# Patient Record
Sex: Female | Born: 1990 | Race: White | Hispanic: No | Marital: Single | Smoking: Former smoker
Health system: Southern US, Community
[De-identification: ages and names within clinical notes are randomized; demographics above are authoritative.]

## PROBLEM LIST (undated history)

## (undated) DIAGNOSIS — N809 Endometriosis, unspecified: Secondary | ICD-10-CM

---

## 2021-09-27 ENCOUNTER — Other Ambulatory Visit: Payer: Self-pay

## 2021-09-27 ENCOUNTER — Encounter (HOSPITAL_COMMUNITY): Payer: Self-pay

## 2021-09-27 ENCOUNTER — Emergency Department (HOSPITAL_COMMUNITY): Payer: Medicaid Other

## 2021-09-27 ENCOUNTER — Emergency Department (HOSPITAL_COMMUNITY)
Admission: EM | Admit: 2021-09-27 | Discharge: 2021-09-27 | Disposition: A | Discharge status: Home / Self Care | Payer: Medicaid Other | Attending: Emergency Medicine | Admitting: Emergency Medicine

## 2021-09-27 DIAGNOSIS — O4690 Antepartum hemorrhage, unspecified, unspecified trimester: Secondary | ICD-10-CM

## 2021-09-27 DIAGNOSIS — Z3A00 Weeks of gestation of pregnancy not specified: Secondary | ICD-10-CM | POA: Insufficient documentation

## 2021-09-27 DIAGNOSIS — O039 Complete or unspecified spontaneous abortion without complication: Secondary | ICD-10-CM | POA: Insufficient documentation

## 2021-09-27 DIAGNOSIS — N9489 Other specified conditions associated with female genital organs and menstrual cycle: Secondary | ICD-10-CM | POA: Diagnosis not present

## 2021-09-27 DIAGNOSIS — O209 Hemorrhage in early pregnancy, unspecified: Secondary | ICD-10-CM | POA: Diagnosis present

## 2021-09-27 DIAGNOSIS — R102 Pelvic and perineal pain: Secondary | ICD-10-CM

## 2021-09-27 HISTORY — DX: Endometriosis, unspecified: N80.9

## 2021-09-27 LAB — COMPREHENSIVE METABOLIC PANEL
ALT: 17 U/L (ref 0–44)
AST: 21 U/L (ref 15–41)
Albumin: 4.6 g/dL (ref 3.5–5.0)
Alkaline Phosphatase: 38 U/L (ref 38–126)
Anion gap: 9 (ref 5–15)
BUN: 14 mg/dL (ref 6–20)
CO2: 23 mmol/L (ref 22–32)
Calcium: 9.7 mg/dL (ref 8.9–10.3)
Chloride: 104 mmol/L (ref 98–111)
Creatinine, Ser: 0.77 mg/dL (ref 0.44–1.00)
GFR, Estimated: >60 mL/min (ref >60)
Glucose, Bld: 105 mg/dL — ABNORMAL HIGH (ref 70–99)
Potassium: 3.8 mmol/L (ref 3.5–5.1)
Sodium: 136 mmol/L (ref 135–145)
Total Bilirubin: 0.8 mg/dL (ref 0.3–1.2)
Total Protein: 7.5 g/dL (ref 6.5–8.1)

## 2021-09-27 LAB — CBC WITH DIFFERENTIAL/PLATELET
Abs Immature Granulocytes: 0.06 10*3/uL (ref 0.00–0.07)
Basophils Absolute: 0.1 10*3/uL (ref 0.0–0.1)
Basophils Relative: 1 %
Eosinophils Absolute: 0.2 10*3/uL (ref 0.0–0.5)
Eosinophils Relative: 1 %
HCT: 42 % (ref 36.0–46.0)
Hemoglobin: 14.5 g/dL (ref 12.0–15.0)
Immature Granulocytes: 0 %
Lymphocytes Relative: 11 %
Lymphs Abs: 1.8 10*3/uL (ref 0.7–4.0)
MCH: 30.9 pg (ref 26.0–34.0)
MCHC: 34.5 g/dL (ref 30.0–36.0)
MCV: 89.6 fL (ref 80.0–100.0)
Monocytes Absolute: 0.6 10*3/uL (ref 0.1–1.0)
Monocytes Relative: 4 %
Neutro Abs: 12.8 10*3/uL — ABNORMAL HIGH (ref 1.7–7.7)
Neutrophils Relative %: 83 %
Platelets: 372 10*3/uL (ref 150–400)
RBC: 4.69 MIL/uL (ref 3.87–5.11)
RDW: 13.4 % (ref 11.5–15.5)
WBC: 15.6 10*3/uL — ABNORMAL HIGH (ref 4.0–10.5)
nRBC: 0 % (ref 0.0–0.2)

## 2021-09-27 LAB — TYPE AND SCREEN
ABO/RH(D): AB POS
Antibody Screen: NEGATIVE

## 2021-09-27 LAB — HCG, SERUM, QUALITATIVE: Preg, Serum: POSITIVE — AB

## 2021-09-27 LAB — HCG, QUANTITATIVE, PREGNANCY: hCG, Beta Chain, Quant, S: 3808 m[IU]/mL — ABNORMAL HIGH (ref <5)

## 2021-09-27 MED ORDER — FERROUS SULFATE 325 (65 FE) MG PO TABS: 325.0000 mg | ORAL_TABLET | Freq: Every day | ORAL | 0 refills | Status: AC

## 2021-09-27 MED ORDER — IBUPROFEN 600 MG PO TABS: 600.0000 mg | ORAL_TABLET | Freq: Four times a day (QID) | ORAL | 0 refills | Status: AC | PRN

## 2021-09-27 MED ORDER — OXYCODONE HCL 5 MG PO TABS
5.0000 mg | ORAL_TABLET | Freq: Once | ORAL | Status: AC
  Administered 2021-09-27: 5 mg via ORAL
  Filled 2021-09-27: qty 1

## 2021-09-27 MED ORDER — HYDROMORPHONE HCL 1 MG/ML IJ SOLN
1.0000 mg | Freq: Once | INTRAMUSCULAR | Status: AC
  Administered 2021-09-27: 1 mg via INTRAVENOUS
  Filled 2021-09-27: qty 1

## 2021-09-27 MED ORDER — SENNOSIDES-DOCUSATE SODIUM 8.6-50 MG PO TABS: 1.0000 | ORAL_TABLET | Freq: Every day | ORAL | 0 refills | Status: AC

## 2021-09-27 MED ORDER — IBUPROFEN 400 MG PO TABS
600.0000 mg | ORAL_TABLET | Freq: Once | ORAL | Status: AC
  Administered 2021-09-27: 600 mg via ORAL
  Filled 2021-09-27: qty 2

## 2021-09-27 MED ORDER — OXYCODONE-ACETAMINOPHEN 5-325 MG PO TABS: 1.0000 | ORAL_TABLET | Freq: Four times a day (QID) | ORAL | 0 refills | Status: AC | PRN

## 2021-09-27 MED ORDER — MORPHINE SULFATE (PF) 4 MG/ML IV SOLN
4.0000 mg | Freq: Once | INTRAVENOUS | Status: AC
  Administered 2021-09-27: 4 mg via INTRAVENOUS
  Filled 2021-09-27: qty 1

## 2021-09-27 MED ORDER — KETOROLAC TROMETHAMINE 30 MG/ML IJ SOLN
30.0000 mg | Freq: Once | INTRAMUSCULAR | Status: AC
  Administered 2021-09-27: 30 mg via INTRAVENOUS
  Filled 2021-09-27: qty 1

## 2021-09-27 NOTE — ED Provider Notes (Signed)
[facility] EMERGENCY DEPARTMENT Provider Note   CSN: [id number] Arrival date & time: 09/27/21  1324     History  Chief Complaint  Patient presents with   Abdominal Pain    Tara Andrade is a 31 y.o. female w/ hx of endometriosis presenting to Ed with vaginal bleeding, abdominal cramping.  Reports LMP was 2 months ago, typically has regular monthly periods - possibility of pregnancy at this time.  Reports began having bleeding 3 days ago, but today became severe, soaking through a pad per hour, with clots, and significant cramping.  No hx of anemia; not on iron.    Hx of 1 abortion, 1 C-section delivery in the past  She lives in [place], moved there 6 months ago, does not have an OBGYN yet.  NKDA No other medical problems No baseline meds  HPI     Home Medications Prior to Admission medications   Medication Sig Start Date End Date Taking? Authorizing Provider  ferrous sulfate 325 (65 FE) MG tablet Take 1 tablet (325 mg total) by mouth daily for 30 doses. 09/27/21 10/27/21 Yes Mallory Enriques, Carola Rhine, MD  ibuprofen (ADVIL) 600 MG tablet Take 1 tablet (600 mg total) by mouth every 6 (six) hours as needed for up to 30 doses for mild pain, moderate pain or cramping (Bleeding). 09/27/21  Yes Trumaine Wimer, Carola Rhine, MD  oxyCODONE-acetaminophen (PERCOCET/ROXICET) 5-325 MG tablet Take 1 tablet by mouth every 6 (six) hours as needed for up to 12 doses for severe pain. 09/27/21  Yes Allysia Ingles, Carola Rhine, MD  senna-docusate (SENOKOT-S) 8.6-50 MG tablet Take 1 tablet by mouth daily for 30 doses. 09/27/21 10/27/21 Yes Harald Quevedo, Carola Rhine, MD      Allergies    Patient has no known allergies.    Review of Systems   Review of Systems  Physical Exam Updated Vital Signs BP 108/77    Pulse (!) 58    Temp 98.2 F (36.8 C) (Oral)    Resp 18    Ht 5\' 2"  (1.575 m)    Wt 86.2 kg    SpO2 98%    BMI 34.75 kg/m  Physical Exam Constitutional:      General: She is not in acute distress. HENT:     Head:  Normocephalic and atraumatic.  Eyes:     Conjunctiva/sclera: Conjunctivae normal.     Pupils: Pupils are equal, round, and reactive to light.  Cardiovascular:     Rate and Rhythm: Normal rate and regular rhythm.  Pulmonary:     Effort: Pulmonary effort is normal. No respiratory distress.  Abdominal:     General: There is no distension.     Tenderness: There is no abdominal tenderness.  Genitourinary:    Comments: Exam performed with chaperone present. External: Normal external female genitalia. No lesions, rashes, drainage, or suspicious lymph nodes. Internal: No CMT. Cervix closed, blood and clots extruding from uterus. NNo lacerations. No foreign bodies.   Skin:    General: Skin is warm and dry.  Neurological:     General: No focal deficit present.     Mental Status: She is alert. Mental status is at baseline.  Psychiatric:        Mood and Affect: Mood normal.        Behavior: Behavior normal.    ED Results / Procedures / Treatments   Labs (all labs ordered are listed, but only abnormal results are displayed) Labs Reviewed  CBC WITH DIFFERENTIAL/PLATELET - Abnormal; Notable for the following components:  Result Value   WBC 15.6 (*)    Neutro Abs 12.8 (*)    All other components within normal limits  HCG, SERUM, QUALITATIVE - Abnormal; Notable for the following components:   Preg, Serum POSITIVE (*)    All other components within normal limits  COMPREHENSIVE METABOLIC PANEL - Abnormal; Notable for the following components:   Glucose, Bld 105 (*)    All other components within normal limits  HCG, QUANTITATIVE, PREGNANCY - Abnormal; Notable for the following components:   hCG, Beta Chain, Quant, S 3,808 (*)    All other components within normal limits  TYPE AND SCREEN    EKG None  Radiology US OB LESS THAN 14 WEEKS WITH OB TRANSVAGINAL  Result Date: 09/27/2021 CLINICAL DATA:  Increased vaginal bleeding EXAM: OBSTETRIC <14 WK [place] AND TRANSVAGINAL OB US  TECHNIQUE: Both transabdominal and transvaginal ultrasound examinations were performed for complete evaluation of the gestation as well as the maternal uterus, adnexal regions, and pelvic cul-de-sac. Transvaginal technique was performed to assess early pregnancy. COMPARISON:  None. FINDINGS: Intrauterine gestational sac: None Yolk sac:  Not Visualized. Embryo:  Not Visualized. Maternal uterus/adnexae: Right ovary measures 4.7 x 2.8 by 3.2 cm and contains probable corpus luteum. Left ovary measures 2.8 by 5.3 x 2.3 cm. No significant free fluid IMPRESSION: No IUP identified. Findings consistent with pregnancy of unknown location, differential of which includes recent failed pregnancy, IUP too early to visualize, and occult ectopic pregnancy. Recommend trending of HCG with repeat ultrasound as indicated. Electronically Signed   By: Donavan Foil M.D.   On: 09/27/2021 17:53    Procedures Procedures    Medications Ordered in ED Medications  morphine (PF) 4 MG/ML injection 4 mg (4 mg Intravenous Given 09/27/21 1526)  ketorolac (TORADOL) 30 MG/ML injection 30 mg (30 mg Intravenous Given 09/27/21 1522)  HYDROmorphone (DILAUDID) injection 1 mg (1 mg Intravenous Given 09/27/21 1636)  oxyCODONE (Oxy IR/ROXICODONE) immediate release tablet 5 mg (5 mg Oral Given 09/27/21 1850)  ibuprofen (ADVIL) tablet 600 mg (600 mg Oral Given 09/27/21 1850)    ED Course/ Medical Decision Making/ A&P Clinical Course as of 09/28/21 0946  Fri Sep 27, 2021  1524 Preg, Serum(!): POSITIVE [MT]    Clinical Course User Index [MT] Wyvonnia Dusky, MD                           Medical Decision Making Amount and/or Complexity of Data Reviewed Labs: ordered. Decision-making details documented in ED Course. Radiology: ordered.  Risk OTC drugs. Prescription drug management.   Heavy vaginal bleeding, abd pain  Ddx includes abnormal uterine bleeding vs pregnancy complication (including spontanous abortion) vs other  IV pain  medications ordered  Labs ordered and personally interpreted showing hgb wnl, WBC elevated 15K  Preg positive, beta hcg +3000 Ultrasoudn ordered and personally reviewed, showing no IUP.  Likely a completed abortion, but patient advised about need for OBGYN f/u and repeat hcg level for ectopic rule out.  Pain improved with IV medications on reassessment  We will discharge home with motrin, percocet, iron, and stool softener; return precautions discussed.        Final Clinical Impression(s) / ED Diagnoses Final diagnoses:  Spontaneous abortion    Rx / DC Orders ED Discharge Orders          Ordered    oxyCODONE-acetaminophen (PERCOCET/ROXICET) 5-325 MG tablet  Every 6 hours PRN        09/27/21  1837    ibuprofen (ADVIL) 600 MG tablet  Every 6 hours PRN        09/27/21 1837    senna-docusate (SENOKOT-S) 8.6-50 MG tablet  Daily        09/27/21 1837    ferrous sulfate 325 (65 FE) MG tablet  Daily        09/27/21 1837              Wyvonnia Dusky, MD 09/28/21 [phone]

## 2021-09-27 NOTE — ED Notes (Signed)
Got pt a soda and ice ok per DR

## 2021-09-27 NOTE — ED Notes (Signed)
ED Provider at bedside. 

## 2021-09-27 NOTE — Discharge Instructions (Addendum)
Please call to schedule follow-up appointment with an OB/GYN provider on Monday or Tuesday.  Someone will need to check your beta-hCG level, which is your pregnancy hormone, to make sure that it is trending down to 0.  Higher levels may indicate signs of an ectopic pregnancy.

## 2021-09-27 NOTE — ED Notes (Signed)
Assisted Dr with pelvic exam

## 2021-09-27 NOTE — ED Notes (Signed)
Patient transported to Ultrasound 

## 2021-09-27 NOTE — ED Triage Notes (Addendum)
Pt to er, pt states that she is here for abd pain and vaginal bleeding. pt states that she has a hx of endometriosis, states that she has missed her period for the past two months, states that she is now going through a tampon every 15-20 minutes.  Pt states that she might be pregnant.  Pt states that she started bleeding about three days ago, but today she started having some pain and the bleeding increased.

## 2023-03-22 IMAGING — US US OB < 14 WEEKS - US OB TV
1 series · 14 of 28 positions shown · non-contrast
Comparison: None.

CLINICAL DATA: Increased vaginal bleeding

EXAM:
OBSTETRIC <14 WK US AND TRANSVAGINAL OB US
TECHNIQUE: Both transabdominal and transvaginal ultrasound examinations were
performed for complete evaluation of the gestation as well as the
maternal uterus, adnexal regions, and pelvic cul-de-sac.
Transvaginal technique was performed to assess early pregnancy.

[Series 1: us ob less than 14 weeks with ob transvaginal · 14 of 105 slices shown]
[im 4/105]
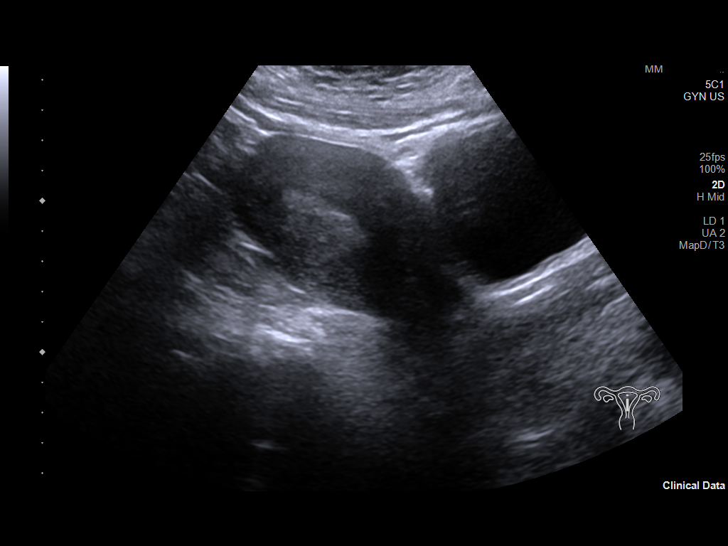
[im 12/105]
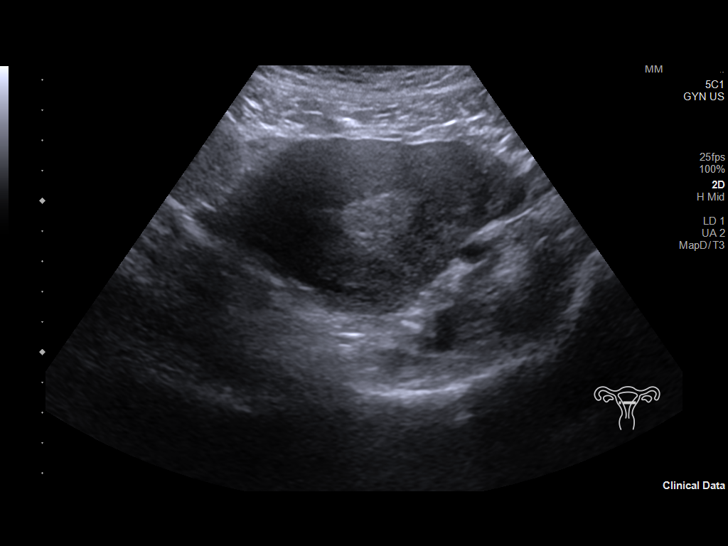
[im 20/105]
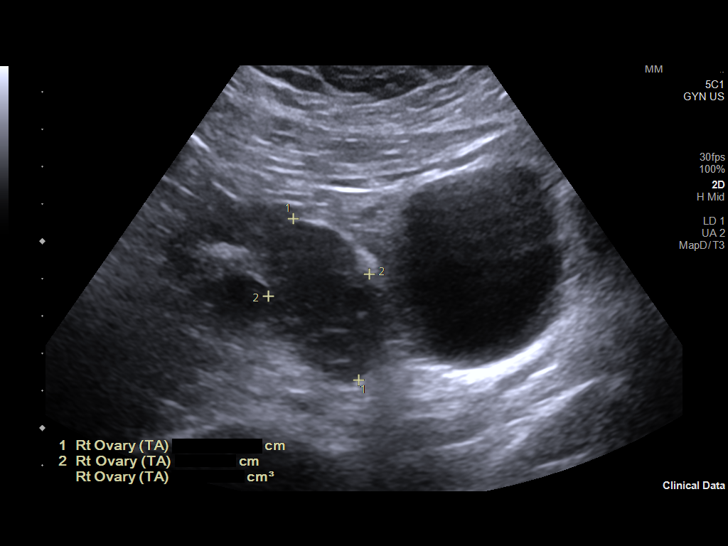
[im 27/105]
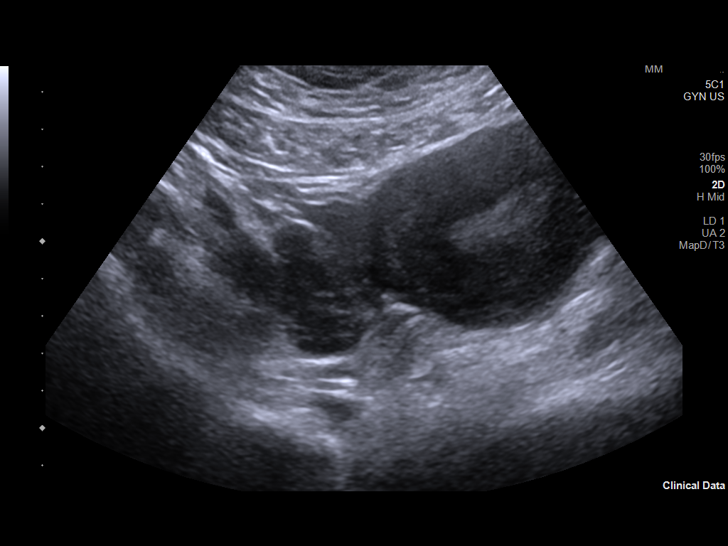
[im 35/105]
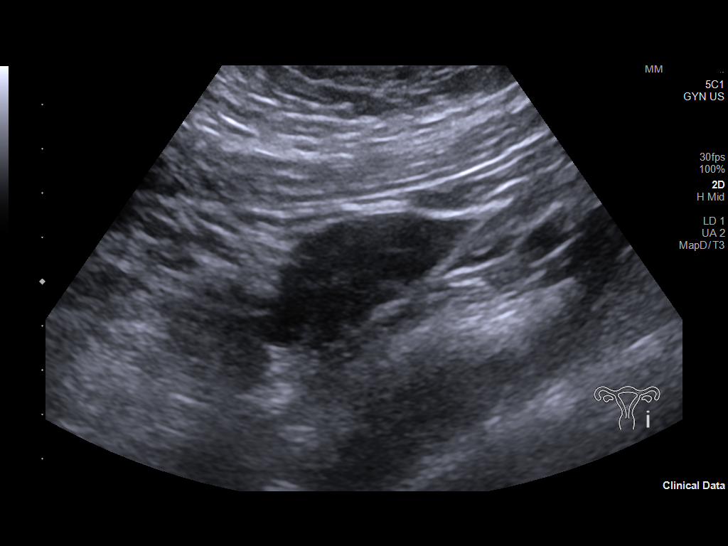
[im 43/105]
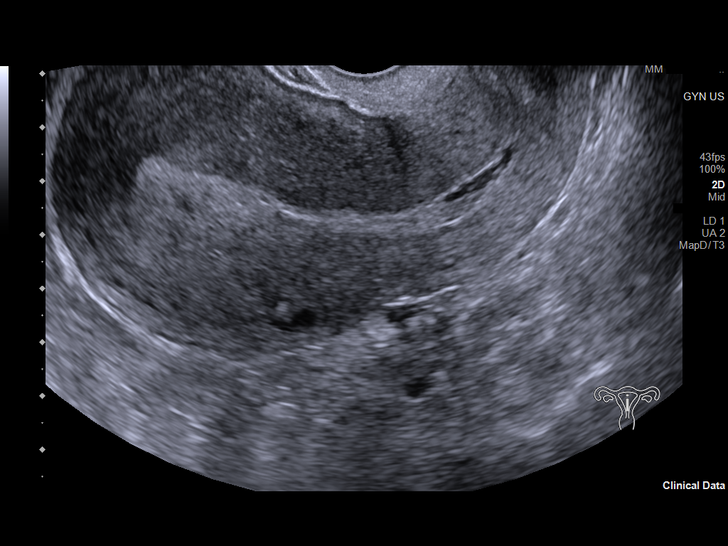
[im 51/105]
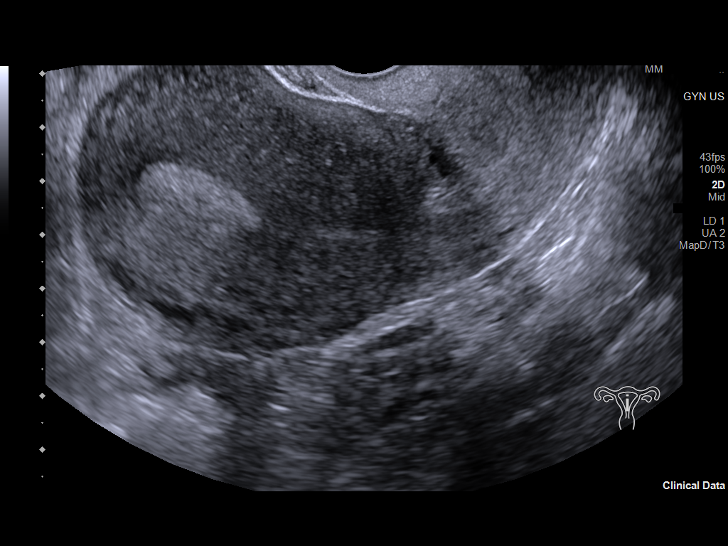
[im 58/105]
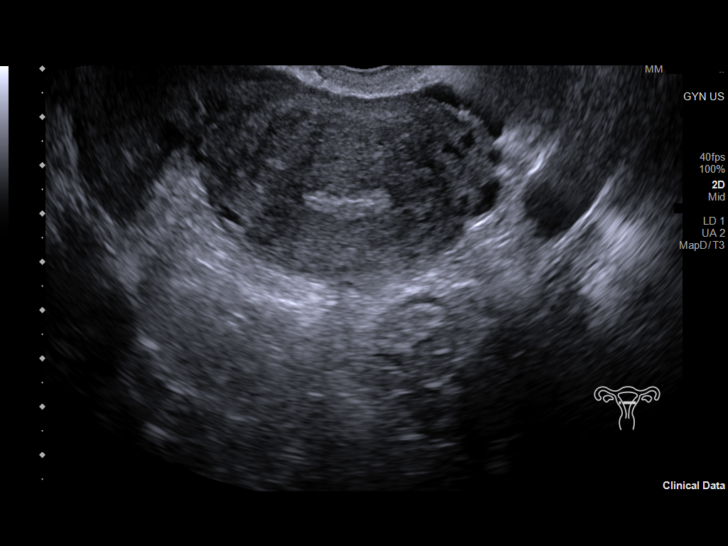
[im 66/105]
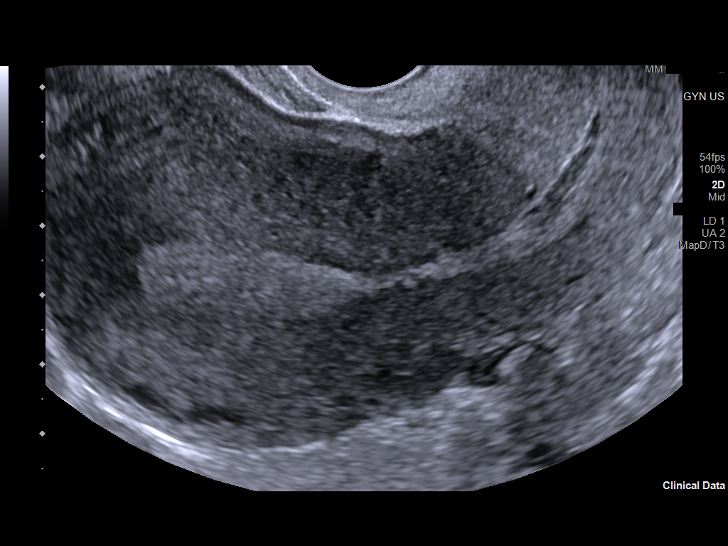
[im 74/105]
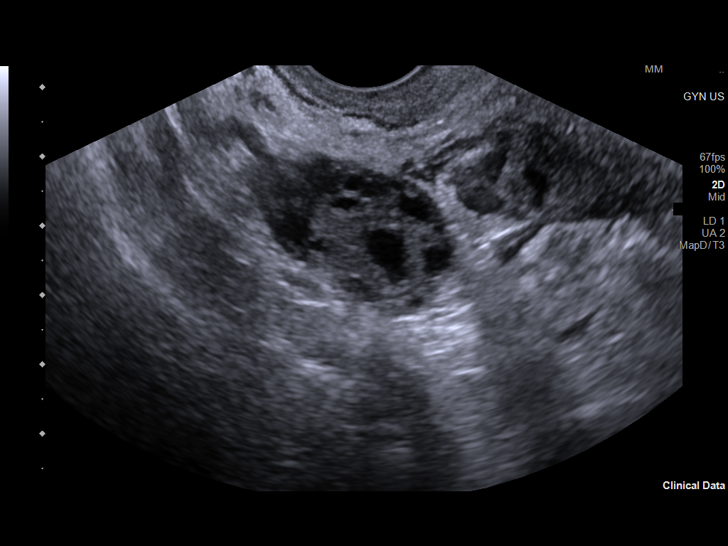
[im 81/105]
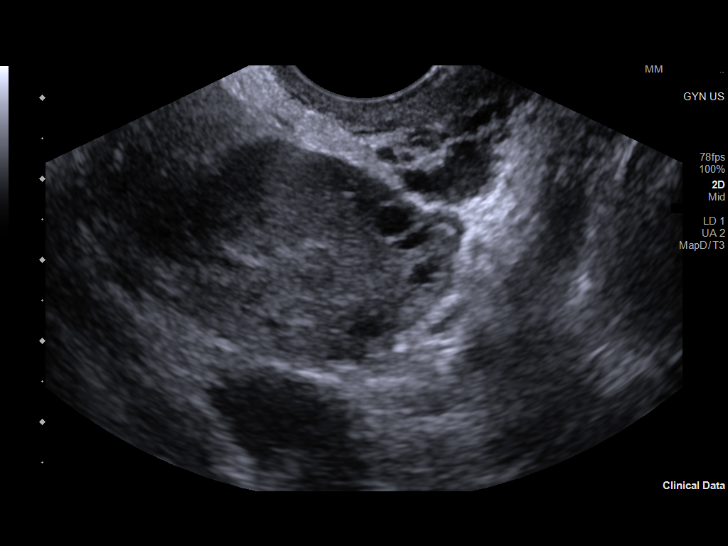
[im 89/105]
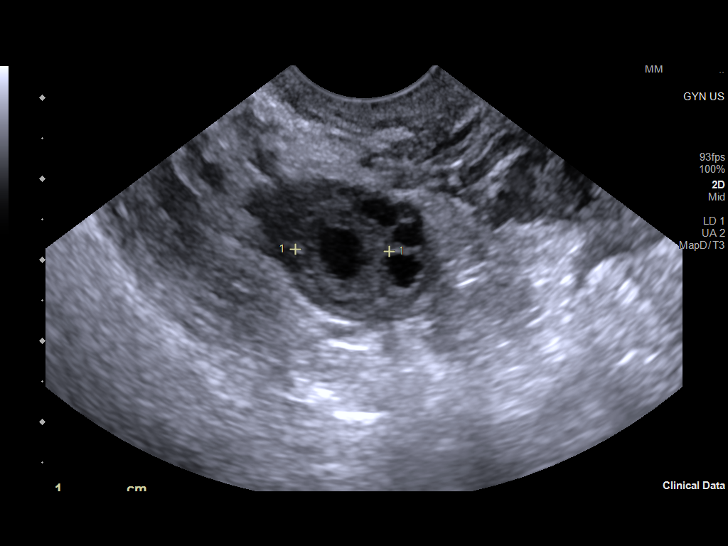
[im 97/105]
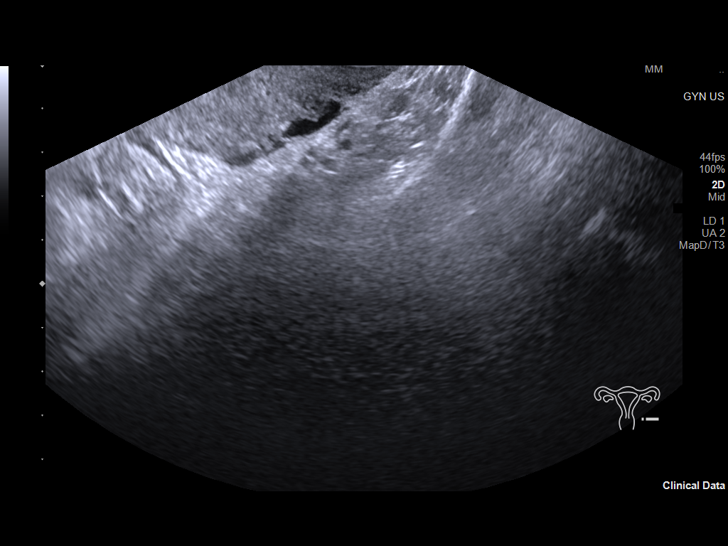
[im 105/105]
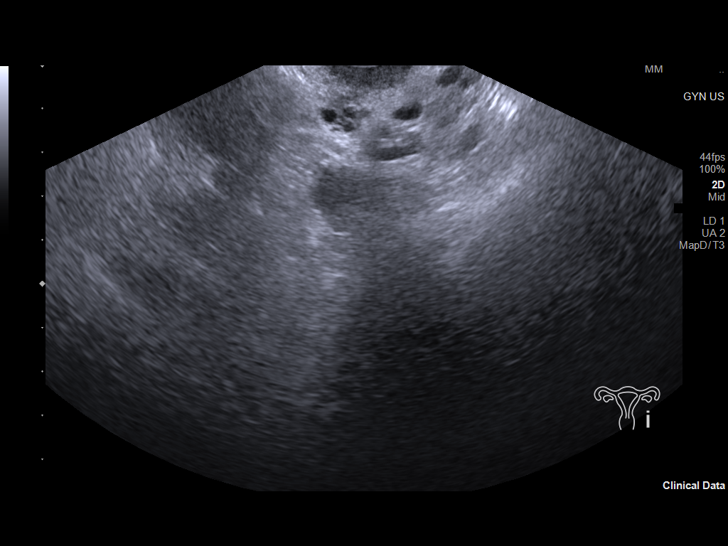

[14 of 28 positions shown; findings below may reference images not displayed]

FINDINGS: Intrauterine gestational sac: None

Yolk sac:  Not Visualized.

Embryo:  Not Visualized.

Maternal uterus/adnexae: Right ovary measures 4.7 x 2.8 by 3.2 cm
and contains probable corpus luteum. Left ovary measures 2.8 by
x 2.3 cm. No significant free fluid
IMPRESSION: No IUP identified. Findings consistent with pregnancy of unknown
location, differential of which includes recent failed pregnancy,
IUP too early to visualize, and occult ectopic pregnancy. Recommend
trending of HCG with repeat ultrasound as indicated.
